# Patient Record
Sex: Female | Born: 1980 | Race: Black or African American | Hispanic: No | Marital: Single | Smoking: Never smoker
Health system: Southern US, Community
[De-identification: ages and names within clinical notes are randomized; demographics above are authoritative.]

## PROBLEM LIST (undated history)

## (undated) DIAGNOSIS — IMO0002 Reserved for concepts with insufficient information to code with codable children: Secondary | ICD-10-CM

## (undated) DIAGNOSIS — R87619 Unspecified abnormal cytological findings in specimens from cervix uteri: Secondary | ICD-10-CM

## (undated) HISTORY — PX: NO PAST SURGERIES: SHX2092

---

## 2003-09-06 ENCOUNTER — Other Ambulatory Visit: Admission: RE | Admit: 2003-09-06 | Discharge: 2003-09-06 | Payer: Self-pay | Admitting: Obstetrics and Gynecology

## 2005-04-08 ENCOUNTER — Emergency Department (HOSPITAL_COMMUNITY): Admission: EM | Admit: 2005-04-08 | Discharge: 2005-04-08 | Payer: Self-pay | Admitting: Emergency Medicine

## 2006-12-16 ENCOUNTER — Emergency Department (HOSPITAL_COMMUNITY): Admission: EM | Admit: 2006-12-16 | Discharge: 2006-12-16 | Payer: Self-pay | Admitting: Emergency Medicine

## 2007-10-02 ENCOUNTER — Emergency Department (HOSPITAL_COMMUNITY): Admission: EM | Admit: 2007-10-02 | Discharge: 2007-10-02 | Payer: Self-pay | Admitting: Family Medicine

## 2008-11-15 ENCOUNTER — Encounter: Admission: RE | Admit: 2008-11-15 | Discharge: 2008-11-15 | Payer: Self-pay | Admitting: Obstetrics & Gynecology

## 2010-10-23 LAB — POCT RAPID STREP A: Streptococcus, Group A Screen (Direct): NEGATIVE

## 2011-03-07 IMAGING — US US PELVIS COMPLETE
1 series · 14 of 25 positions shown · non-contrast
Comparison: None.

CLINICAL DATA: Right adnexal mass on physical exam.

TRANSABDOMINAL AND TRANSVAGINAL ULTRASOUND OF PELVIS
TECHNIQUE: Both transabdominal and transvaginal ultrasound
examinations of the pelvis were performed including evaluation of
the uterus, ovaries, adnexal regions, and pelvic cul-de-sac.

[Series 1: us pelvis complete · 0.22mm/px · 14 of 53 slices shown]
[im 1/53]
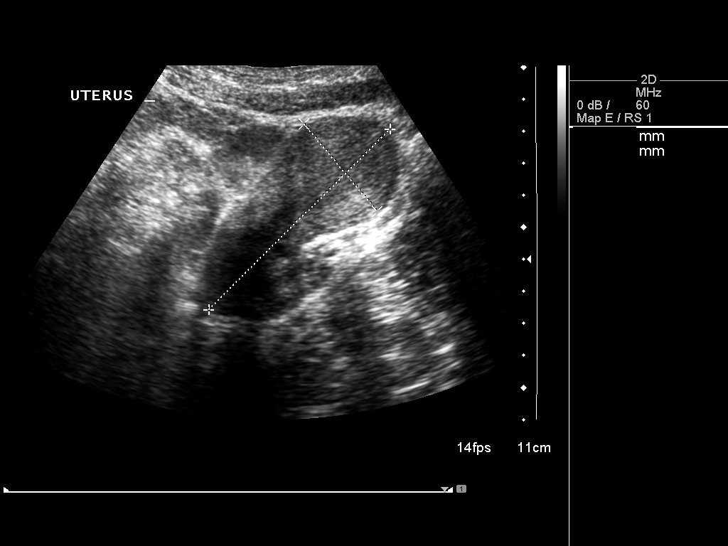
[im 5/53]
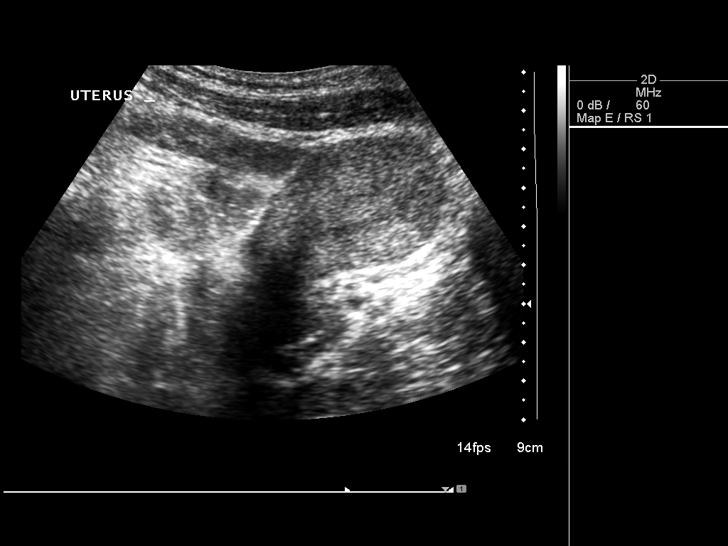
[im 9/53]
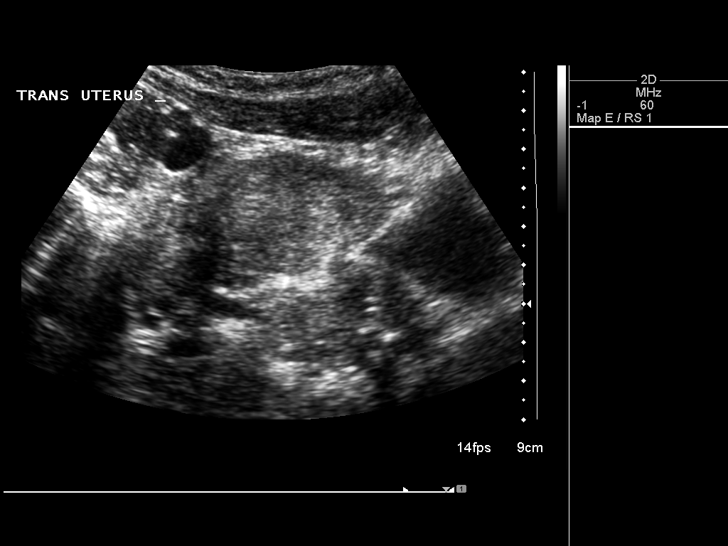
[im 14/53]
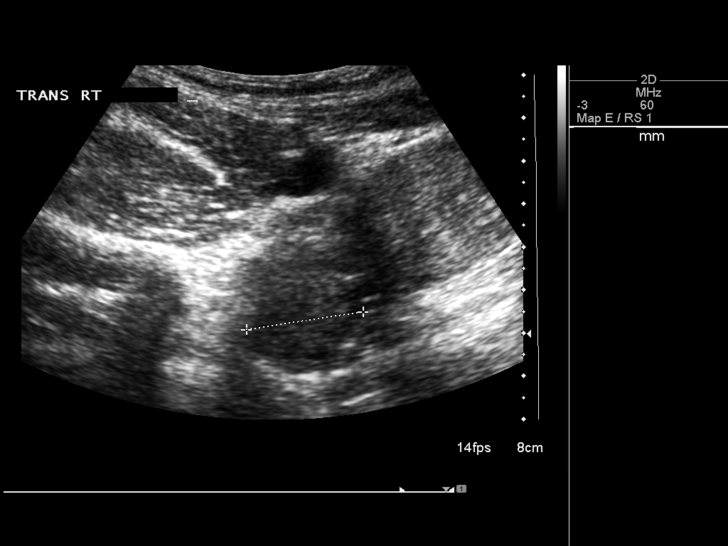
[im 18/53]
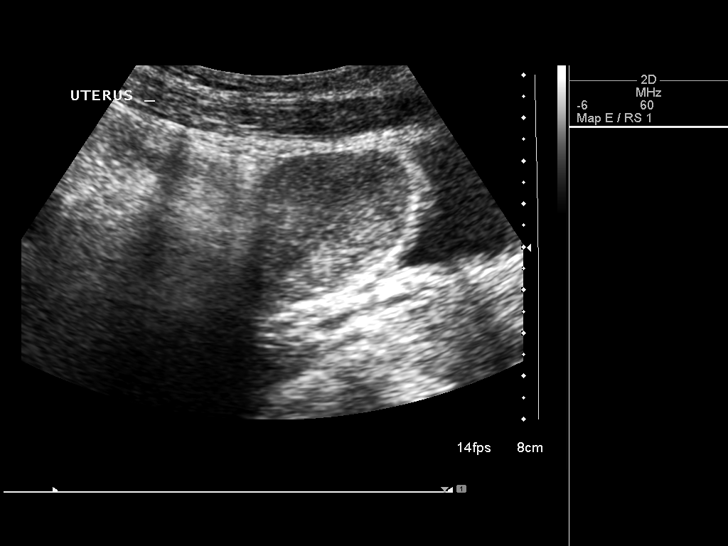
[im 20/53]
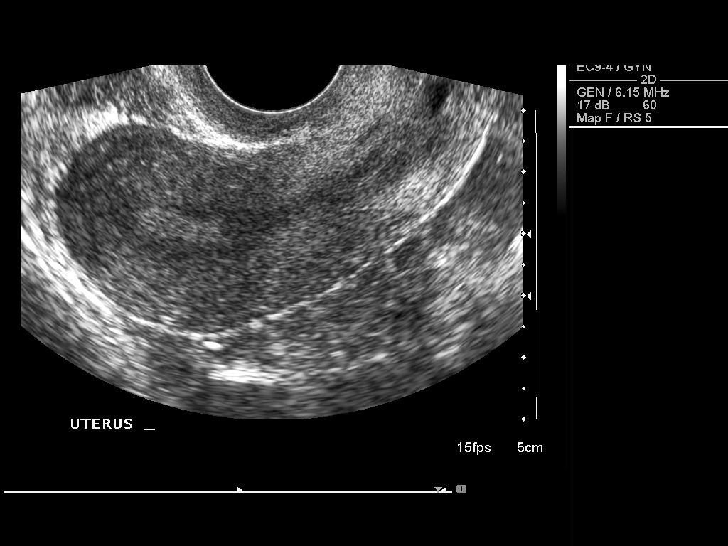
[im 24/53]
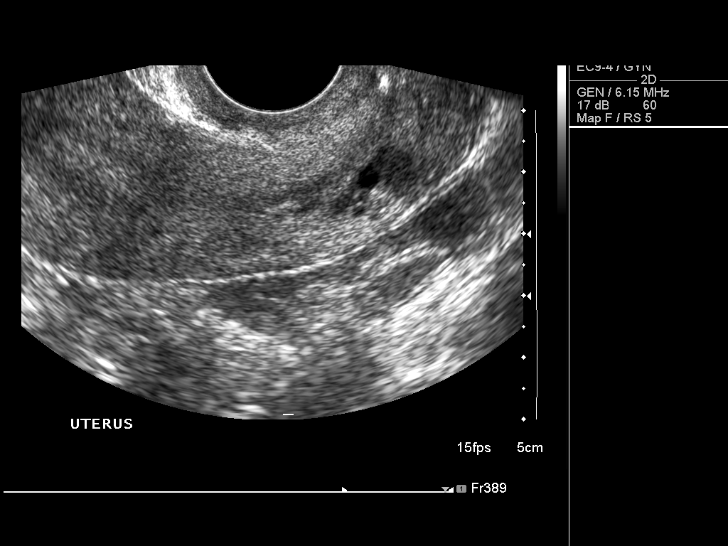
[im 29/53]
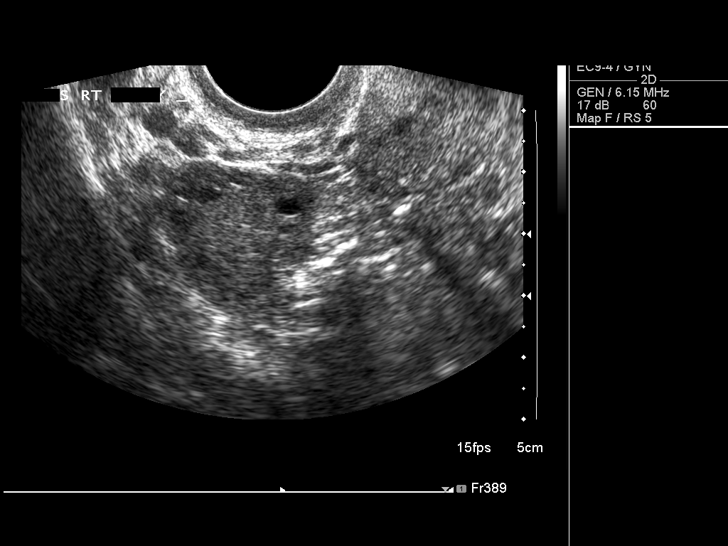
[im 33/53]
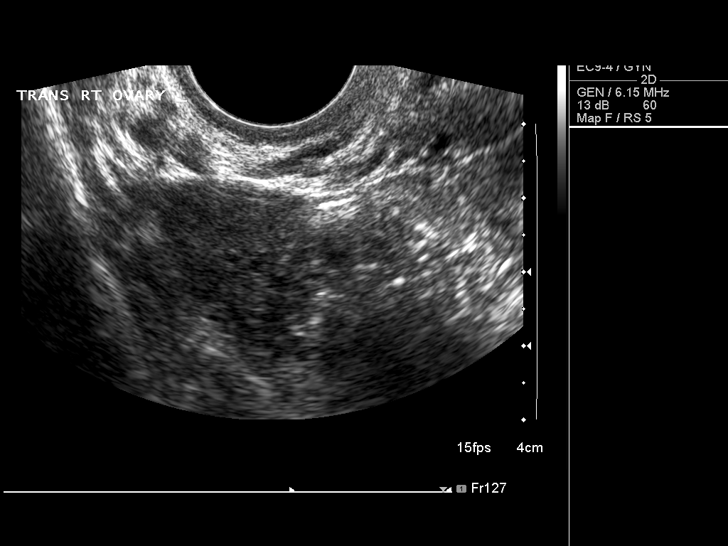
[im 35/53]
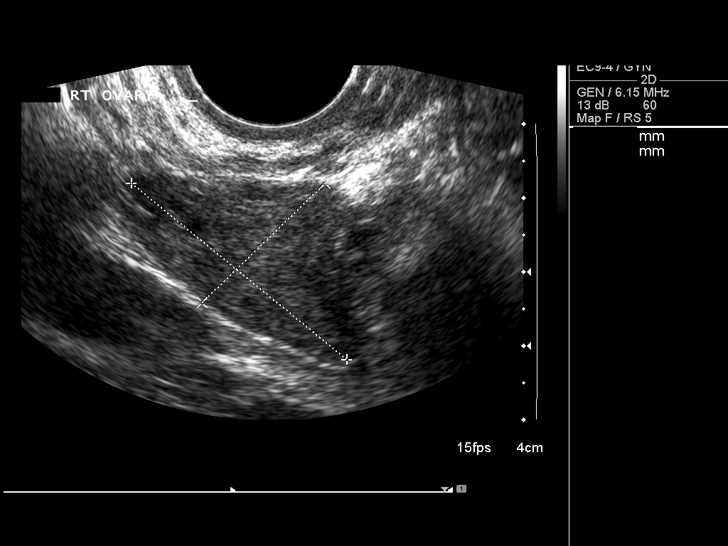
[im 40/53]
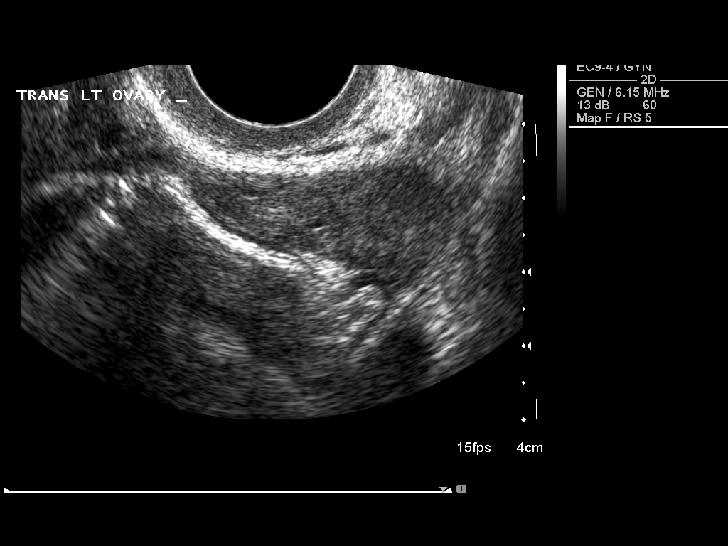
[im 44/53]
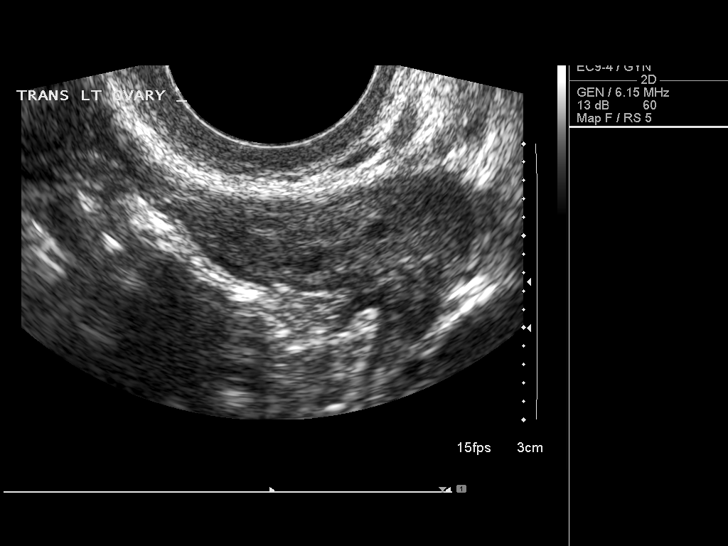
[im 48/53]
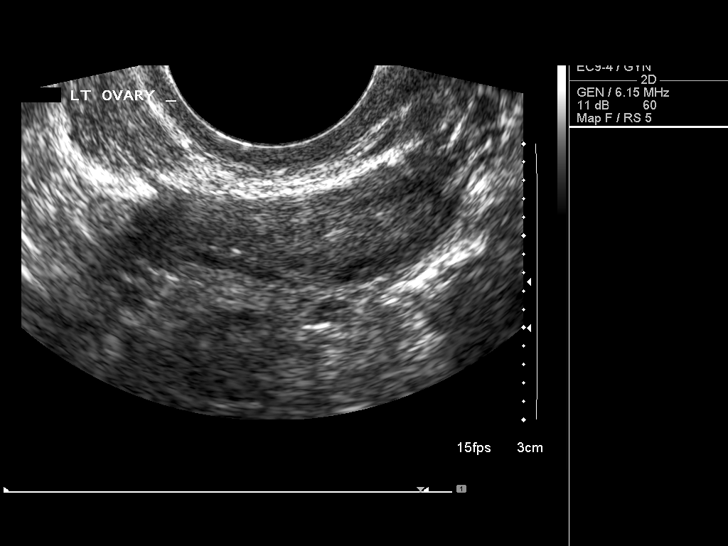
[im 53/53]
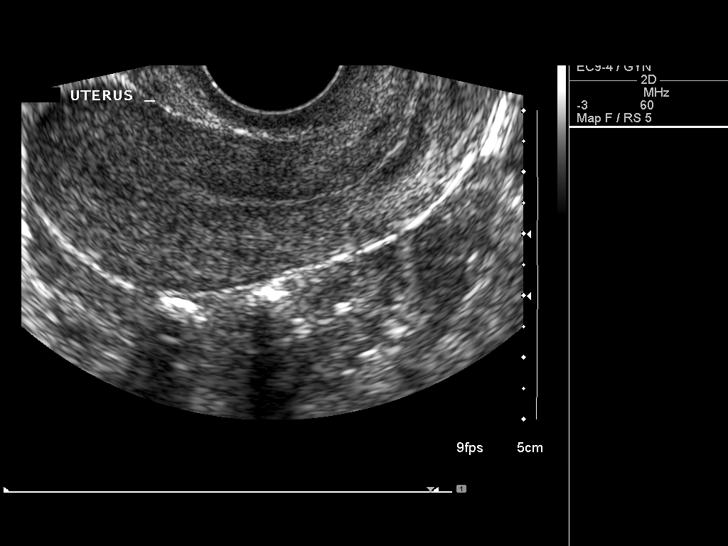

[14 of 25 positions shown; findings below may reference images not displayed]

FINDINGS: Uterus measures 8.0 x 3.6 x 4.8 cm, negative.

Endometrium measures 5 mm, within normal limits.

Right Ovary measures 3.8 x 2.3 x 2.5 cm, negative.

Left Ovary measures 3.2 x 1.0 x 3.4 cm, negative.

Other Findings:  Trace free fluid is likely physiologic.  No
evidence of a right adnexal mass.
IMPRESSION: No acute findings.  No evidence of a right adnexal mass.

## 2012-01-16 NOTE — L&D Delivery Note (Signed)
Delivery Note At 3:19 PM a viable female was delivered via Vaginal, Spontaneous Delivery (Presentation: ; Occiput Anterior).  APGAR: 8, 9; weight 4 lb 6.2 oz (1990 g).   Placenta status: Intact, Spontaneous.  Cord: 3 vessels with the following complications: None.    Anesthesia:  none Episiotomy: None Lacerations: None Suture Repair: n/a Est. Blood Loss (mL): 300  Mom to postpartum.  Baby to NICU.  Almond Fitzgibbon C. 01/05/2013, 4:10 PM

## 2012-04-22 ENCOUNTER — Ambulatory Visit: Payer: Self-pay | Admitting: Family Medicine

## 2012-04-22 VITALS — BP 136/89 | HR 89 | Temp 98.8°F | Resp 16 | Ht 64.0 in | Wt 182.0 lb

## 2012-04-22 DIAGNOSIS — Z0289 Encounter for other administrative examinations: Secondary | ICD-10-CM

## 2012-04-22 NOTE — Progress Notes (Signed)
  Tuberculosis Risk Questionnaire   1. Were you born outside the [place] in one of the following parts of the world:    [place], [place], [place], [place] or [place]?  No  2. Have you traveled outside the [place] and lived for more than one month in one of the following parts of the world:  [place], [place], [place], [place] or [place]?  No  3. Do you have a compromised immune system such as from any of the following conditions:  HIV/AIDS, organ or bone marrow transplantation, diabetes, immunosuppressive   medicines (e.g. Prednisone, Remicaide), leukemia, lymphoma, cancer of the   head or neck, gastrectomy or jejunal bypass, end-stage renal disease (on   dialysis), or silicosis?  No    4. Have you ever done one of the following:    Used crack cocaine, injected illegal drugs, worked or resided in jail or prison,   worked or resided at a homeless shelter, or worked as a Research scientist (physical sciences) in   direct contact with patients?  No  5. Have you ever been exposed to anyone with infectious tuberculosis?  No   Tuberculosis Symptom Questionnaire  Do you currently have any of the following symptoms?  1. Unexplained cough lasting more than 3 weeks? No  Unexplained fever lasting more than 3 weeks. No   3. Night Sweats (sweating that leaves the bedclothes and sheets wet)   No  4. Shortness of Breath No  5. Chest Pain No  6. Unintentional weight loss  No  7. Unexplained fatigue (very tired for no reason) No     NORMAL TB SCREEN      Sandria Bales. Alwyn Ren MD 04/22/12

## 2012-04-22 NOTE — Progress Notes (Signed)
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 She's been healthy. She needs a form completed insuring is no obvious evidence of communicable diseases. Her TB screening questionnaire is negative.  Past history: Medications: None Allergies: None Previous illnesses: None Previous surgeries: None Gravida 1 Ab1  Family history: Father's history is unknown. Mother's history reveals some chronic diseases such as hypertension and arthritis  Social history: Patient is single. No children. Drinks on weekends, heavily at times. 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  Review of systems: Constitutional: Unremarkable Cardiovascular: Unremarkable GI: Unremarkable GU: Unremarkable Skeletal: Unremarkable Dermatologic: Unremarkable Neurologic: Unremarkable Psychiatric: Unremarkable   Physical examination: Well-developed young lady in no major distress. TMs normal. Eyes PERRLA. Throat clear. Neck supple without nodes thyromegaly. Chest clear to auscultation. Heart regular without murmurs gallops or arrhythmias. Abdomen soft without mass or tenderness. Extremities unremarkable. Spine normal.  Assessment: Normal examination with no evidence of communicable diseases  Plan: Advised exercise, limit alcohol intake, and return when necessary.

## 2012-09-11 ENCOUNTER — Other Ambulatory Visit (HOSPITAL_COMMUNITY): Payer: Self-pay | Admitting: Nurse Practitioner

## 2012-09-11 DIAGNOSIS — Z0489 Encounter for examination and observation for other specified reasons: Secondary | ICD-10-CM

## 2012-09-18 ENCOUNTER — Other Ambulatory Visit: Payer: Self-pay

## 2012-09-24 ENCOUNTER — Other Ambulatory Visit (HOSPITAL_COMMUNITY): Payer: Self-pay | Admitting: Nurse Practitioner

## 2012-09-24 DIAGNOSIS — Z0489 Encounter for examination and observation for other specified reasons: Secondary | ICD-10-CM

## 2012-09-25 ENCOUNTER — Ambulatory Visit (HOSPITAL_COMMUNITY)
Admission: RE | Admit: 2012-09-25 | Discharge: 2012-09-25 | Disposition: A | Discharge status: Home / Self Care | Payer: Medicaid Other | Source: Ambulatory Visit | Attending: Nurse Practitioner | Admitting: Nurse Practitioner

## 2012-09-25 DIAGNOSIS — O3500X0 Maternal care for (suspected) central nervous system malformation or damage in fetus, unspecified, not applicable or unspecified: Secondary | ICD-10-CM | POA: Insufficient documentation

## 2012-09-25 DIAGNOSIS — Z0489 Encounter for examination and observation for other specified reasons: Secondary | ICD-10-CM

## 2012-09-25 DIAGNOSIS — Z1389 Encounter for screening for other disorder: Secondary | ICD-10-CM | POA: Insufficient documentation

## 2012-09-25 DIAGNOSIS — O358XX0 Maternal care for other (suspected) fetal abnormality and damage, not applicable or unspecified: Secondary | ICD-10-CM | POA: Insufficient documentation

## 2012-09-25 DIAGNOSIS — O350XX0 Maternal care for (suspected) central nervous system malformation in fetus, not applicable or unspecified: Secondary | ICD-10-CM | POA: Insufficient documentation

## 2012-09-25 DIAGNOSIS — Z363 Encounter for antenatal screening for malformations: Secondary | ICD-10-CM | POA: Insufficient documentation

## 2013-01-05 ENCOUNTER — Encounter (HOSPITAL_COMMUNITY): Payer: Self-pay

## 2013-01-05 ENCOUNTER — Inpatient Hospital Stay (HOSPITAL_COMMUNITY)
Admission: AD | Admit: 2013-01-05 | Discharge: 2013-01-07 | DRG: 774 | Disposition: A | Discharge status: Home / Self Care | Payer: Medicaid Other | Source: Ambulatory Visit | Attending: Obstetrics & Gynecology | Admitting: Obstetrics & Gynecology

## 2013-01-05 ENCOUNTER — Inpatient Hospital Stay (HOSPITAL_COMMUNITY): Payer: Medicaid Other

## 2013-01-05 DIAGNOSIS — O9882 Other maternal infectious and parasitic diseases complicating childbirth: Secondary | ICD-10-CM

## 2013-01-05 DIAGNOSIS — O4290 Premature rupture of membranes, unspecified as to length of time between rupture and onset of labor, unspecified weeks of gestation: Secondary | ICD-10-CM

## 2013-01-05 DIAGNOSIS — A599 Trichomoniasis, unspecified: Secondary | ICD-10-CM | POA: Diagnosis present

## 2013-01-05 DIAGNOSIS — A5901 Trichomonal vulvovaginitis: Secondary | ICD-10-CM

## 2013-01-05 DIAGNOSIS — O42919 Preterm premature rupture of membranes, unspecified as to length of time between rupture and onset of labor, unspecified trimester: Secondary | ICD-10-CM | POA: Diagnosis present

## 2013-01-05 HISTORY — DX: Reserved for concepts with insufficient information to code with codable children: IMO0002

## 2013-01-05 HISTORY — DX: Unspecified abnormal cytological findings in specimens from cervix uteri: R87.619

## 2013-01-05 LAB — COMPREHENSIVE METABOLIC PANEL
ALT: 8 U/L (ref 0–35)
AST: 14 U/L (ref 0–37)
CO2: 22 mEq/L (ref 19–32)
Calcium: 9.3 mg/dL (ref 8.4–10.5)
Chloride: 101 mEq/L (ref 96–112)
GFR calc non Af Amer: >90 mL/min (ref >90)
Sodium: 134 mEq/L — ABNORMAL LOW (ref 135–145)
Total Bilirubin: 0.3 mg/dL (ref 0.3–1.2)

## 2013-01-05 LAB — WET PREP, GENITAL
Clue Cells Wet Prep HPF POC: NONE SEEN
Yeast Wet Prep HPF POC: NONE SEEN

## 2013-01-05 LAB — CBC
MCH: 30.1 pg (ref 26.0–34.0)
MCV: 85.4 fL (ref 78.0–100.0)
Platelets: 160 10*3/uL (ref 150–400)
RBC: 4.19 MIL/uL (ref 3.87–5.11)

## 2013-01-05 LAB — ABO/RH: ABO/RH(D): O POS

## 2013-01-05 LAB — GROUP B STREP BY PCR: Group B strep by PCR: NEGATIVE

## 2013-01-05 MED ORDER — ONDANSETRON HCL 4 MG PO TABS: 4.0000 mg | ORAL_TABLET | ORAL | Status: DC | PRN

## 2013-01-05 MED ORDER — BETAMETHASONE SOD PHOS & ACET 6 (3-3) MG/ML IJ SUSP
12.0000 mg | INTRAMUSCULAR | Status: DC
  Filled 2013-01-05: qty 2

## 2013-01-05 MED ORDER — SODIUM CHLORIDE 0.9 % IV SOLN
2.0000 g | Freq: Four times a day (QID) | INTRAVENOUS | Status: DC
  Administered 2013-01-05 (×2): 2 g via INTRAVENOUS
  Filled 2013-01-05 (×4): qty 2000

## 2013-01-05 MED ORDER — WITCH HAZEL-GLYCERIN EX PADS: 1.0000 "application " | MEDICATED_PAD | CUTANEOUS | Status: DC | PRN

## 2013-01-05 MED ORDER — SODIUM CHLORIDE 0.9 % IV SOLN
250.0000 mg | Freq: Four times a day (QID) | INTRAVENOUS | Status: DC
  Administered 2013-01-05 (×2): 250 mg via INTRAVENOUS
  Filled 2013-01-05 (×4): qty 5

## 2013-01-05 MED ORDER — DIPHENHYDRAMINE HCL 25 MG PO CAPS: 25.0000 mg | ORAL_CAPSULE | Freq: Four times a day (QID) | ORAL | Status: DC | PRN

## 2013-01-05 MED ORDER — BETAMETHASONE SOD PHOS & ACET 6 (3-3) MG/ML IJ SUSP
12.0000 mg | Freq: Once | INTRAMUSCULAR | Status: AC
  Administered 2013-01-05: 12 mg via INTRAMUSCULAR
  Filled 2013-01-05: qty 2

## 2013-01-05 MED ORDER — DOCUSATE SODIUM 100 MG PO CAPS: 100.0000 mg | ORAL_CAPSULE | Freq: Every day | ORAL | Status: DC

## 2013-01-05 MED ORDER — AMOXICILLIN 500 MG PO CAPS: 500.0000 mg | ORAL_CAPSULE | Freq: Three times a day (TID) | ORAL | Status: DC

## 2013-01-05 MED ORDER — SENNOSIDES-DOCUSATE SODIUM 8.6-50 MG PO TABS
2.0000 | ORAL_TABLET | ORAL | Status: DC
  Administered 2013-01-06 (×2): 2 via ORAL
  Filled 2013-01-05 (×2): qty 2

## 2013-01-05 MED ORDER — SIMETHICONE 80 MG PO CHEW: 80.0000 mg | CHEWABLE_TABLET | ORAL | Status: DC | PRN

## 2013-01-05 MED ORDER — PRENATAL MULTIVITAMIN CH
1.0000 | ORAL_TABLET | Freq: Every day | ORAL | Status: DC
  Administered 2013-01-05: 1 via ORAL
  Filled 2013-01-05: qty 1

## 2013-01-05 MED ORDER — IBUPROFEN 600 MG PO TABS
600.0000 mg | ORAL_TABLET | Freq: Four times a day (QID) | ORAL | Status: DC
  Administered 2013-01-05 – 2013-01-07 (×8): 600 mg via ORAL
  Filled 2013-01-05 (×9): qty 1

## 2013-01-05 MED ORDER — ONDANSETRON HCL 4 MG/2ML IJ SOLN: 4.0000 mg | INTRAMUSCULAR | Status: DC | PRN

## 2013-01-05 MED ORDER — DIBUCAINE 1 % RE OINT: 1.0000 "application " | TOPICAL_OINTMENT | RECTAL | Status: DC | PRN

## 2013-01-05 MED ORDER — ZOLPIDEM TARTRATE 5 MG PO TABS: 5.0000 mg | ORAL_TABLET | Freq: Every evening | ORAL | Status: DC | PRN

## 2013-01-05 MED ORDER — PRENATAL MULTIVITAMIN CH
1.0000 | ORAL_TABLET | Freq: Every day | ORAL | Status: DC
  Administered 2013-01-06 – 2013-01-07 (×2): 1 via ORAL
  Filled 2013-01-05 (×2): qty 1

## 2013-01-05 MED ORDER — OXYTOCIN 40 UNITS IN LACTATED RINGERS INFUSION - SIMPLE MED
INTRAVENOUS | Status: AC
  Filled 2013-01-05: qty 1000

## 2013-01-05 MED ORDER — ERYTHROMYCIN BASE 250 MG PO TABS: 250.0000 mg | ORAL_TABLET | Freq: Four times a day (QID) | ORAL | Status: DC

## 2013-01-05 MED ORDER — LANOLIN HYDROUS EX OINT: TOPICAL_OINTMENT | CUTANEOUS | Status: DC | PRN

## 2013-01-05 MED ORDER — TETANUS-DIPHTH-ACELL PERTUSSIS 5-2.5-18.5 LF-MCG/0.5 IM SUSP
0.5000 mL | Freq: Once | INTRAMUSCULAR | Status: AC
  Administered 2013-01-06: 0.5 mL via INTRAMUSCULAR
  Filled 2013-01-05: qty 0.5

## 2013-01-05 MED ORDER — OXYCODONE-ACETAMINOPHEN 5-325 MG PO TABS
1.0000 | ORAL_TABLET | ORAL | Status: DC | PRN
Start: 2013-01-05 — End: 2013-01-07
  Administered 2013-01-06 (×3): 1 via ORAL
  Administered 2013-01-07: 2 via ORAL
  Administered 2013-01-07: 1 via ORAL
  Filled 2013-01-05 (×2): qty 2
  Filled 2013-01-05 (×3): qty 1

## 2013-01-05 MED ORDER — BENZOCAINE-MENTHOL 20-0.5 % EX AERO: 1.0000 "application " | INHALATION_SPRAY | CUTANEOUS | Status: DC | PRN

## 2013-01-05 MED ORDER — METRONIDAZOLE 500 MG PO TABS
2000.0000 mg | ORAL_TABLET | Freq: Once | ORAL | Status: AC
  Administered 2013-01-05: 2000 mg via ORAL
  Filled 2013-01-05: qty 4

## 2013-01-05 MED ORDER — ACETAMINOPHEN 325 MG PO TABS: 650.0000 mg | ORAL_TABLET | ORAL | Status: DC | PRN

## 2013-01-05 MED ORDER — PROMETHAZINE HCL 25 MG PO TABS
25.0000 mg | ORAL_TABLET | Freq: Four times a day (QID) | ORAL | Status: DC | PRN
  Administered 2013-01-05: 25 mg via ORAL
  Filled 2013-01-05: qty 1

## 2013-01-05 MED ORDER — CALCIUM CARBONATE ANTACID 500 MG PO CHEW: 2.0000 | CHEWABLE_TABLET | ORAL | Status: DC | PRN

## 2013-01-05 MED ORDER — OXYCODONE-ACETAMINOPHEN 5-325 MG PO TABS
1.0000 | ORAL_TABLET | ORAL | Status: DC | PRN
  Administered 2013-01-05: 2 via ORAL
  Filled 2013-01-05: qty 2

## 2013-01-05 NOTE — H&P (Signed)
Mia Alvarado is a 32 y.o. female presenting for rupture of membranes at 0300 on 01/05/13 and irregular contractions.  Pt received prenatal care at [facility] beginning at 16 wks IUP.  Pregnancy is dated by certain LMP consistent with 18 wk ultrasound.  No report of complications during pregnancy other than an elevated MSAFP.  18 wk ultrasound normal.  Pt reports headache early this AM that has since resolved.  Denies epigastric pain or vision changes.    Maternal Medical History:  Reason for admission: Rupture of membranes.   Contractions: Onset was 3-5 hours ago.   Frequency: irregular.   Perceived severity is mild.    Fetal activity: Perceived fetal activity is normal.   Last perceived fetal movement was within the past hour.      OB History   Grav Para Term Preterm Abortions TAB SAB Ect Mult Living   3    2 1 1         Past Medical History  Diagnosis Date  . Abnormal Pap smear     colposcopy > normal    Past Surgical History  Procedure Laterality Date  . No past surgeries     Family History: family history includes Alzheimer's disease in her paternal grandmother; Diabetes in her brother and maternal grandmother; Diverticulitis in her maternal grandmother; Diverticulosis in her mother; Gout in her maternal grandfather; Hypertension in her mother. Social History:  reports that she has never smoked. She does not have any smokeless tobacco history on file. She reports that she drinks alcohol. She reports that she does not use illicit drugs.   Prenatal Transfer Tool  Maternal Diabetes: No Genetic Screening: Abnormal:  Results: Elevated AFP Maternal Ultrasounds/Referrals: Normal Fetal Ultrasounds or other Referrals:  None Maternal Substance Abuse:  No Significant Maternal Medications:  Meds include: Progesterone Significant Maternal Lab Results:  Lab values include: Other: GBS unknown Other Comments:  Elevated blood pressure.  Review of Systems  Eyes: Negative for blurred  vision.  Gastrointestinal: Positive for abdominal pain (contractions).  Genitourinary:       Rupture of membranes  Neurological: Positive for headaches.  All other systems reviewed and are negative.      Blood pressure 131/90, pulse 113, temperature 98.6 F (37 C), temperature source Oral, resp. rate 18, last menstrual period 05/19/2012. Exam Physical Exam  Physical Exam  Constitutional: She is oriented to person, place, and time. She appears well-developed and well-nourished. No distress.  HENT:  Head: Normocephalic.  Eyes: Pupils are equal, round, and reactive to light.  Neck: Normal range of motion. Neck supple.  Cardiovascular: Normal rate and regular rhythm.  Respiratory: Effort normal and breath sounds normal.  GI: Soft. There is no tenderness.  Genitourinary: No bleeding around the vagina. Vaginal discharge (+pooling, +fern) found.  Cervix visually closed  Musculoskeletal: Normal range of motion. Edema: 2+ bilat pedal edema.  Neurological: She is alert and oriented to person, place, and time. She displays abnormal reflex (2+ bilat).  Skin: Skin is warm and dry.   Prenatal labs: ABO, Rh:   Antibody:   Rubella:   RPR:    HBsAg:    HIV:    GBS:     Assessment/Plan: 32 yo G3P0020 at [redacted]w[redacted]d weeks IUP  Preterm Premature Rupture of Membranes  Elevated Blood Pressure    Plan:  Admit to antenatal  Betamethasone IM x 2  Begin IV antibiotics  Obtain CBC, CMP  OB Ultrasound for presentation  Obtain wet prep, GBS pcr, GC/CT   [facility]  01/05/2013, 6:24 AM

## 2013-01-05 NOTE — Progress Notes (Signed)
Patient ID: Mia Alvarado, female   DOB: June 27, 1980, 32 y.o.   MRN: [id number]  Called for report of increased pain.  Contractions occurring about every 5-7 minutes FHR not tracing.  Advised to try one Percocet and report back if pain is unrelieved, may need to check cervix.

## 2013-01-05 NOTE — Progress Notes (Signed)
Patient ID: Mia Alvarado, female   DOB: May 12, 1980, 32 y.o.   MRN: [id number]  Results for orders placed during the hospital encounter of 01/05/13 (from the past 24 hour(s))  WET PREP, GENITAL     Status: Abnormal   Collection Time    01/05/13  6:08 AM      Result Value Range   Yeast Wet Prep HPF POC NONE SEEN  NONE SEEN   Trich, Wet Prep FEW (*) NONE SEEN   Clue Cells Wet Prep HPF POC NONE SEEN  NONE SEEN   WBC, Wet Prep HPF POC FEW (*) NONE SEEN  GROUP B STREP BY PCR     Status: None   Collection Time    01/05/13  6:08 AM      Result Value Range   Group B strep by PCR NEGATIVE  NEGATIVE   Noted to be positive for Trichomonas  Patient notified of diagnosis and need for treatment Informed needs to tell partner who needs to be treated also  WIll treat with Flagyl 2gm PO x 1

## 2013-01-05 NOTE — MAU Note (Signed)
"  Water broke at 0308"  Clear fluid with blood tinge mucus.  Felt some contractions start.

## 2013-01-05 NOTE — Progress Notes (Signed)
Will transfer to 172 via wheelchair

## 2013-01-05 NOTE — Progress Notes (Signed)
Patient ID: Mia Alvarado, female   DOB: 04-08-1980, 32 y.o.   MRN: [id number]  Notified that patient's cervix is 9 cm dilated UCs did not slow down Feels pressure Dr Marice Potter at bedside  Will transfer to [facility] for delivery

## 2013-01-05 NOTE — MAU Provider Note (Signed)
  History     CSN: [id number]  Arrival date and time: 01/05/13 0454   None     Chief Complaint  Patient presents with  . Labor Eval   HPI  Pt is a 32 yo G3P0020 at [redacted]w[redacted]d weeks IUP here with report of gush of fluid around 0300 this AM while laying in bed.  Pt reports felt a burst and went to bathroom and continued to see fluid.  Reports contractions started around 30 minutes later and occuring every 10 minutes.  [facility] with no complications.  Pt denies vision changes or epigastric pain.  +headache this am that has since resolved.    Past Medical History  Diagnosis Date  . Abnormal Pap smear     colposcopy > normal     Past Surgical History  Procedure Laterality Date  . No past surgeries      Family History  Problem Relation Age of Onset  . Hypertension Mother   . Diverticulosis Mother   . Diabetes Brother   . Diabetes Maternal Grandmother   . Diverticulitis Maternal Grandmother   . Gout Maternal Grandfather   . Alzheimer's disease Paternal Grandmother     History  Substance Use Topics  . Smoking status: Never Smoker   . Smokeless tobacco: Not on file  . Alcohol Use: Yes    Allergies: No Known Allergies  Prescriptions prior to admission  Medication Sig Dispense Refill  . Prenatal Vit-Fe Fumarate-FA (PRENATAL MULTIVITAMIN) TABS tablet Take 1 tablet by mouth daily at 12 noon.        Review of Systems  Eyes: Negative for blurred vision and double vision.  Gastrointestinal: Positive for abdominal pain. Diarrhea: contractions.  Genitourinary:       Leaking of fluid  Neurological: Positive for headaches.  All other systems reviewed and are negative.   Physical Exam   Blood pressure 131/90, pulse 113, temperature 98.6 F (37 C), temperature source Oral, resp. rate 18, last menstrual period 05/19/2012.  Physical Exam  Constitutional: She is oriented to person, place, and time. She appears well-developed and  well-nourished. No distress.  HENT:  Head: Normocephalic.  Eyes: Pupils are equal, round, and reactive to light.  Neck: Normal range of motion. Neck supple.  Cardiovascular: Normal rate and regular rhythm.   Respiratory: Effort normal and breath sounds normal.  GI: Soft. There is no tenderness.  Genitourinary: No bleeding around the vagina. Vaginal discharge (+pooling, +fern) found.  Cervix visually closed  Musculoskeletal: Normal range of motion. Edema:  2+ bilat pedal edema.  Neurological: She is alert and oriented to person, place, and time. She displays abnormal reflex (2+ bilat).  Skin: Skin is warm and dry.    MAU Course  Procedures   Assessment and Plan  32 yo G3P0020 at [redacted]w[redacted]d weeks IUP Preterm Premature Rupture of Membranes Elevated Blood Pressure  Plan: Admit to antenatal Betamethasone IM x 2 Begin IV antibiotics Obtain CBC, CMP OB Ultrasound for presentation Obtain wet prep, GBS pcr, GC/CT  [facility] 01/05/2013, 5:45 AM

## 2013-01-06 DIAGNOSIS — A599 Trichomoniasis, unspecified: Secondary | ICD-10-CM | POA: Diagnosis present

## 2013-01-06 LAB — GC/CHLAMYDIA PROBE AMP
CT Probe RNA: NEGATIVE
GC Probe RNA: NEGATIVE

## 2013-01-06 NOTE — Progress Notes (Signed)
Post Partum Day 1  Subjective: No complaints, up ad lib, voiding, tolerating PO and + flatus .  Baby doing well in NICU.  Objective: Blood pressure 112/61, pulse 109, temperature 98.1 F (36.7 C), temperature source Oral, resp. rate 20, height 5\' 3"  (1.6 m), weight 222 lb 6 oz (100.869 kg), last menstrual period 05/19/2012, SpO2 100.00%, unknown if currently breastfeeding.  Physical Exam:  General: alert and no distress Lochia: appropriate Uterine Fundus: firm DVT Evaluation: No evidence of DVT seen on physical exam. Negative Homan's sign.   Recent Labs  01/05/13 0650  HGB 12.6  HCT 35.8*    Assessment/Plan: Doing well postpartum Had a lengthy discussion with patient and her mother about circumstances of her labor and delivery. She was upset she did not get an epidural and that her labor progressed quickly; she was told that there was no way to slow down true preterm labor, especially in the setting of possible infection (patient was diagnosed with trichomonas).  She was upset that she did not get an epidural in time; she was told that patients who rapidly progress in labor occasionally do not have time to get an epidural.  At the end of the discussion, the patient and her mother seemed satisfied with the explanations.  Breastfeeding Contraception: IUD vs Nexplanon Plan for discharge tomorrow   LOS: 1 day   Tereso Newcomer, M.D. 01/06/2013, 1:39 PM

## 2013-01-06 NOTE — Lactation Note (Signed)
This note was copied from the chart of Mia Sharmain Lastra. Lactation Consultation Note     Initial consult with this mom of a NICU baby, now 21 hours pot partum, and 33 1/7 weeks corrected gestation. Mom has been pumping, and expressed 6 mls  of colostrum, a very large amount. I showed mom how to set premie setting, and how to hand express. Mom return demonstrated with good technique. Basic pumping teaching done from the NICU booklet on providing EBm for a NICU baby. Mom is active with [facility] and will need a [facility] loaner on discharge, if we have one available.   Patient Name: Mia Alvarado Date: 01/06/2013 Reason for consult: Initial assessment;NICU baby;Infant < 6lbs;Late preterm infant   Maternal Data Formula Feeding for Exclusion: Yes (baby in NICU) Infant to breast within first hour of birth: No Breastfeeding delayed due to:: Infant status Has patient been taught Hand Expression?: Yes Does the patient have breastfeeding experience prior to this delivery?: No  Feeding    LATCH Score/Interventions                      Lactation Tools Discussed/Used Tools: Pump Breast pump type: Double-Electric Breast Pump WIC Program: Yes (mom will need a loaner DEP, WIC closed until 12/29) Initiated by:: bedisde Rn  Date initiated:: 01/05/13   Consult Status Consult Status: Follow-up Date: 01/07/13 Follow-up type: In-patient    Alfred Levins 01/06/2013, 12:55 PM

## 2013-01-06 NOTE — Progress Notes (Signed)
UR completed 

## 2013-01-07 MED ORDER — IBUPROFEN 600 MG PO TABS: 600.0000 mg | ORAL_TABLET | Freq: Four times a day (QID) | ORAL | Status: AC

## 2013-01-07 NOTE — Progress Notes (Signed)
Pt discharged to home with sister.  Pt ambulated to NICU with plans to leave hospital from there.  Pt home with rented breast pump from hospital lactation department.  No other equipment for home ordered at discharge.

## 2013-01-07 NOTE — Discharge Summary (Signed)
Obstetric Discharge Summary Reason for Admission: PPROM and preterm labor Prenatal Procedures: NST and ultrasound Intrapartum Procedures: spontaneous vaginal delivery, elevated BP with normal PIH labs Postpartum Procedures: none Complications-Operative and Postpartum: none Hemoglobin  Date Value Range Status  01/05/2013 12.6  12.0 - 15.0 g/dL Final     HCT  Date Value Range Status  01/05/2013 35.8* 36.0 - 46.0 % Final    Physical Exam:  General: alert, cooperative and no distress Lochia: appropriate Uterine Fundus: firm Incision: na DVT Evaluation: No evidence of DVT seen on physical exam. No significant calf/ankle edema.  Discharge Diagnoses: PPROM and subsequent PTL with delivery  Discharge Information: Date: 01/07/2013 Activity: pelvic rest Diet: routine Medications: PNV and Ibuprofen Condition: stable Instructions: refer to practice specific booklet Discharge to: home Follow-up Information   Follow up with [facility]-. Schedule an appointment as soon as possible for a visit in 6 weeks. (for your postpartum visit)    Contact information:   [address] [place] [address] [phone]      Newborn Data: Live born female  Birth Weight: 4 lb 6.2 oz (1990 g) APGAR: 8, 9  Home with mother.  32 yo [id number] who presented with PPROM at 33 weeks and went into PTL.  She progressed to deliver a liveborn female via NSVD without complications.  Her postpartum care was uncomplicated. She did have some elevated BPs on admission with negative labs for pre-eclampsia. Post delivery, her pressures normalized.  Pt was found to be trichomonas positive and was treated while in house.  She is breast feeding and desires nexplanon for contraception.  Pt to f/u at the health department in 6 weeks.   Parilee Hally L 01/07/2013, 7:54 AM

## 2013-01-10 ENCOUNTER — Inpatient Hospital Stay (HOSPITAL_COMMUNITY)
Admission: AD | Admit: 2013-01-10 | Discharge: 2013-01-11 | Disposition: A | Discharge status: Home / Self Care | Payer: Medicaid Other | Source: Ambulatory Visit | Attending: Obstetrics & Gynecology | Admitting: Obstetrics & Gynecology

## 2013-01-10 ENCOUNTER — Inpatient Hospital Stay (HOSPITAL_COMMUNITY): Payer: Medicaid Other

## 2013-01-10 ENCOUNTER — Encounter (HOSPITAL_COMMUNITY): Payer: Self-pay | Admitting: *Deleted

## 2013-01-10 DIAGNOSIS — R1011 Right upper quadrant pain: Secondary | ICD-10-CM | POA: Insufficient documentation

## 2013-01-10 DIAGNOSIS — O99893 Other specified diseases and conditions complicating puerperium: Secondary | ICD-10-CM | POA: Insufficient documentation

## 2013-01-10 DIAGNOSIS — R1013 Epigastric pain: Secondary | ICD-10-CM | POA: Insufficient documentation

## 2013-01-10 DIAGNOSIS — R109 Unspecified abdominal pain: Secondary | ICD-10-CM

## 2013-01-10 DIAGNOSIS — M5380 Other specified dorsopathies, site unspecified: Secondary | ICD-10-CM | POA: Insufficient documentation

## 2013-01-10 LAB — COMPREHENSIVE METABOLIC PANEL
ALT: 15 U/L (ref 0–35)
CO2: 24 mEq/L (ref 19–32)
Calcium: 9.1 mg/dL (ref 8.4–10.5)
Chloride: 103 mEq/L (ref 96–112)
Creatinine, Ser: 0.72 mg/dL (ref 0.50–1.10)
GFR calc Af Amer: >90 mL/min (ref >90)
GFR calc non Af Amer: >90 mL/min (ref >90)
Glucose, Bld: 91 mg/dL (ref 70–99)
Sodium: 136 mEq/L (ref 135–145)
Total Bilirubin: 0.2 mg/dL — ABNORMAL LOW (ref 0.3–1.2)
Total Protein: 6.6 g/dL (ref 6.0–8.3)

## 2013-01-10 LAB — CBC
HCT: 35.9 % — ABNORMAL LOW (ref 36.0–46.0)
Hemoglobin: 12.4 g/dL (ref 12.0–15.0)
MCH: 29.6 pg (ref 26.0–34.0)
MCHC: 34.5 g/dL (ref 30.0–36.0)
MCV: 85.7 fL (ref 78.0–100.0)
RBC: 4.19 MIL/uL (ref 3.87–5.11)
WBC: 12 10*3/uL — ABNORMAL HIGH (ref 4.0–10.5)

## 2013-01-10 MED ORDER — GI COCKTAIL ~~LOC~~
30.0000 mL | Freq: Once | ORAL | Status: AC
  Administered 2013-01-10: 30 mL via ORAL
  Filled 2013-01-10: qty 30

## 2013-01-10 MED ORDER — CYCLOBENZAPRINE HCL 10 MG PO TABS
10.0000 mg | ORAL_TABLET | Freq: Once | ORAL | Status: AC
  Administered 2013-01-10: 10 mg via ORAL
  Filled 2013-01-10: qty 1

## 2013-01-10 NOTE — MAU Provider Note (Signed)
History     CSN: [id number]  Arrival date and time: 01/10/13 1857   None     Chief Complaint  Patient presents with  . Abdominal Pain   HPI This is a 32 y.o. female who is 5 days postpartum following a vaginal delivery at 33 weeks presents with complaint of abdominal pain. Pain is epigastric and started yesterday. No nausea, vomiting, diarrhea or constipation. No fever.  Was treated with amoxicillin and erythromycin prior to delivery. Was found positive for trichomonas and was treated with Flagyl also.   RN Note: Patient presents with complaint of postpartal abdominal pain X 5 days.      OB History   Grav Para Term Preterm Abortions TAB SAB Ect Mult Living   3 1  1 2 1 1   1       Past Medical History  Diagnosis Date  . Abnormal Pap smear     colposcopy > normal     Past Surgical History  Procedure Laterality Date  . No past surgeries      Family History  Problem Relation Age of Onset  . Hypertension Mother   . Diverticulosis Mother   . Diabetes Brother   . Diabetes Maternal Grandmother   . Diverticulitis Maternal Grandmother   . Gout Maternal Grandfather   . Alzheimer's disease Paternal Grandmother     History  Substance Use Topics  . Smoking status: Never Smoker   . Smokeless tobacco: Never Used  . Alcohol Use: No    Allergies: No Known Allergies  Prescriptions prior to admission  Medication Sig Dispense Refill  . ibuprofen (ADVIL,MOTRIN) 600 MG tablet Take 1 tablet (600 mg total) by mouth every 6 (six) hours.  30 tablet  0  . Prenatal Vit-Fe Fumarate-FA (PRENATAL MULTIVITAMIN) TABS tablet Take 1 tablet by mouth daily at 12 noon.        Review of Systems  Constitutional: Negative for fever, chills and malaise/fatigue.  Gastrointestinal: Positive for abdominal pain. Negative for nausea, vomiting, diarrhea and constipation.  Genitourinary: Negative for dysuria.  Musculoskeletal: Negative for myalgias.  Neurological: Negative for dizziness,  weakness and headaches.   Physical Exam   Last menstrual period 05/19/2012, unknown if currently breastfeeding.  Physical Exam  Constitutional: She is oriented to person, place, and time. She appears well-developed and well-nourished. No distress.  HENT:  Head: Normocephalic.  Cardiovascular: Normal rate.   Respiratory: Effort normal.  GI: Soft. She exhibits no distension and no mass. There is tenderness (Slightly tender upper abdomen, lower abdomen completely nontender). There is no rebound and no guarding.  Musculoskeletal: Normal range of motion.  Neurological: She is alert and oriented to person, place, and time.  Skin: Skin is warm and dry.  Psychiatric: She has a normal mood and affect.   Results for orders placed during the hospital encounter of 01/10/13 (from the past 72 hour(s))  CBC     Status: Abnormal   Collection Time    01/10/13  8:50 PM      Result Value Range   WBC 12.0 (*) 4.0 - 10.5 K/uL   RBC 4.19  3.87 - 5.11 MIL/uL   Hemoglobin 12.4  12.0 - 15.0 g/dL   HCT 40.9 (*) 81.1 - 91.4 %   MCV 85.7  78.0 - 100.0 fL   MCH 29.6  26.0 - 34.0 pg   MCHC 34.5  30.0 - 36.0 g/dL   RDW 78.2  95.6 - 21.3 %   Platelets 227  150 - 400  K/uL  COMPREHENSIVE METABOLIC PANEL     Status: Abnormal   Collection Time    01/10/13  8:50 PM      Result Value Range   Sodium 136  135 - 145 mEq/L   Potassium 3.8  3.5 - 5.1 mEq/L   Chloride 103  96 - 112 mEq/L   CO2 24  19 - 32 mEq/L   Glucose, Bld 91  70 - 99 mg/dL   BUN 12  6 - 23 mg/dL   Creatinine, Ser 1.61  0.50 - 1.10 mg/dL   Calcium 9.1  8.4 - 09.6 mg/dL   Total Protein 6.6  6.0 - 8.3 g/dL   Albumin 2.6 (*) 3.5 - 5.2 g/dL   AST 19  0 - 37 U/L   ALT 15  0 - 35 U/L   Alkaline Phosphatase 88  39 - 117 U/L   Total Bilirubin 0.2 (*) 0.3 - 1.2 mg/dL   GFR calc non Af Amer >90  >90 mL/min   GFR calc Af Amer >90  >90 mL/min   Comment: (NOTE)     The eGFR has been calculated using the CKD EPI equation.     This calculation has not  been validated in all clinical situations.     eGFR's persistently <90 mL/min signify possible Chronic Kidney     Disease.     MAU Course  Procedures  MDM Will try some GI cocktail May be GERD  >>  No relief from med.   Now tells me she has also had some midback spasm along with upper abdominal pain.  Spasms come and go and are sometimes very strong. Has been leaning forward for breast pumping.  Working with Lactation to get her technique improved. ?referred pain from spasm Will try Flexeril to see if it helps.  >>  Got some relief then pain came back Will get RUQ ultrasound  >> US Abdomen Limited Ruq  01/10/2013   CLINICAL DATA:  Right upper quadrant pain  EXAM: US ABDOMEN LIMITED - RIGHT UPPER QUADRANT  COMPARISON:  None.    FINDINGS: Gallbladder  No gallstones or wall thickening visualized. No sonographic Murphy sign noted.  Common bile duct  Diameter: 2 mm.  Liver:  No focal lesion identified. Within normal limits in parenchymal echogenicity.   IMPRESSION: No acute finding.   Electronically Signed   By: Ruel Favors M.D.   On: 01/10/2013 23:59   Discussed with Dr Penne Lash and ER physician. He recommends expectant management.  We could do a CT, but would not expect to see anything necessarily, as [place] is best for imaging gallbladder.  May try Miralax to see if this could be gas pains that need to be moved through. Patient instructed to use Flexeril prn and if pain persists or gets worse or if she develops any other symptoms, go to [facility] or [facility] ED.  Assessment and Plan  A:  Postpartum x 5 days following preterm delivery      Right upper quadrant and epigastric pain      Undetermined cause of pain      Back spasms  P:  Discussed with patient      Will continue Flexeril prn for back pain       Tums or Zantac prn reflux       Miralax x 1-2 days       Advised to go to ED if persists, worsens or develops other symptoms  [facility] 01/10/2013, 7:21 PM

## 2013-01-10 NOTE — MAU Note (Signed)
Patient presents with complaint of postpartal abdominal pain X 5 days.

## 2013-01-11 DIAGNOSIS — R109 Unspecified abdominal pain: Secondary | ICD-10-CM

## 2013-01-11 MED ORDER — OXYCODONE-ACETAMINOPHEN 5-325 MG PO TABS: 1.0000 | ORAL_TABLET | ORAL | Status: AC | PRN

## 2013-01-11 MED ORDER — POLYETHYLENE GLYCOL 3350 17 G PO PACK: 17.0000 g | PACK | Freq: Every day | ORAL | Status: AC

## 2013-01-11 NOTE — Progress Notes (Signed)
Wynelle Bourgeois CNM in earlier and discussed test results and d/c plan. WRitten and verbal d/c instructions given and understanding voiced

## 2013-01-12 NOTE — Discharge Summary (Signed)
Attestation of Attending Supervision of Obstetric Fellow: Evaluation and management procedures were performed by the Obstetric Fellow under my supervision and collaboration.  I have reviewed the Obstetric Fellow's note and chart, and I agree with the management and plan.  Arzell Mcgeehan, MD, FACOG Attending Obstetrician & Gynecologist Faculty Practice, [facility]   

## 2013-01-18 NOTE — MAU Provider Note (Signed)
Attestation of Attending Supervision of Advanced Practitioner (CNM/NP): Evaluation and management procedures were performed by the Advanced Practitioner under my supervision and collaboration. I have reviewed the Advanced Practitioner's note and chart, and I agree with the management and plan.  Gus Littler H. 12:26 PM

## 2013-01-19 ENCOUNTER — Ambulatory Visit: Payer: Self-pay

## 2013-01-19 NOTE — Lactation Note (Signed)
This note was copied from the chart of Mia Alvarado Longbottom. Lactation Consultation Note     Follow up consult with this mom of a now [redacted] week gestation baby, being discharged from the NICU today. Mom is pumping and bottle feeding, but would like to transition to breast feeding. i reviewed with her how her baby is a late pre term baby, and it will take a few weeks to transition him. I advised mom to get settled at home, and then call for an outpatient lactation consult with me, to I can help her with transitioning her baby to breast feeding.   Patient Name: Mia Alvarado Wiers [id number]'s Date: 01/19/2013     Maternal Data    Feeding    LATCH Score/Interventions                      Lactation Tools Discussed/Used     Consult Status      Alfred LevinsLee, Diontae Route Anne 01/19/2013, 5:02 PM

## 2013-11-16 ENCOUNTER — Encounter (HOSPITAL_COMMUNITY): Payer: Self-pay | Admitting: *Deleted

## 2015-06-14 ENCOUNTER — Ambulatory Visit (HOSPITAL_COMMUNITY)
Admission: EM | Admit: 2015-06-14 | Discharge: 2015-06-14 | Disposition: A | Discharge status: Home / Self Care | Payer: Medicaid Other | Attending: Family Medicine | Admitting: Family Medicine

## 2015-06-14 ENCOUNTER — Encounter (HOSPITAL_COMMUNITY): Payer: Self-pay | Admitting: Emergency Medicine

## 2015-06-14 DIAGNOSIS — L0291 Cutaneous abscess, unspecified: Secondary | ICD-10-CM

## 2015-06-14 MED ORDER — SULFAMETHOXAZOLE-TRIMETHOPRIM 800-160 MG PO TABS: 1.0000 | ORAL_TABLET | Freq: Two times a day (BID) | ORAL | Status: AC

## 2015-06-14 MED ORDER — MUPIROCIN CALCIUM 2 % EX CREA: 1.0000 "application " | TOPICAL_CREAM | Freq: Two times a day (BID) | CUTANEOUS | Status: AC

## 2015-06-14 NOTE — ED Notes (Signed)
PT reports chills and hot flashes last Saturday. Body aches last Sunday. These symptoms resolved Sunday. Monday, PT noticed a boil on left inner thigh. Boil worsened. Boil came to a head Sunday night and began leaking. PT tried to open it more with a safety pin. PT reports it is looking better now, but she is concerned it may not heal without antibiotics.

## 2015-06-14 NOTE — ED Provider Notes (Signed)
CSN: [id number]     Arrival date & time 06/14/15  1532 History   First MD Initiated Contact with Patient 06/14/15 1610     No chief complaint on file.  (Consider location/radiation/quality/duration/timing/severity/associated sxs/prior Treatment) HPI History obtained from patient:  Pt presents with the cc of:  Left thigh Duration of symptoms: 3 days Treatment prior to arrival: Hot packs Context: Sudden onset of swelling left thigh Other symptoms include: none Pain score: 3 FAMILY HISTORY: Hypertension-mother    Past Medical History  Diagnosis Date  . Abnormal Pap smear     colposcopy > normal    Past Surgical History  Procedure Laterality Date  . No past surgeries     Family History  Problem Relation Age of Onset  . Hypertension Mother   . Diverticulosis Mother   . Diabetes Brother   . Diabetes Maternal Grandmother   . Diverticulitis Maternal Grandmother   . Gout Maternal Grandfather   . Alzheimer's disease Paternal Grandmother    Social History  Substance Use Topics  . Smoking status: Never Smoker   . Smokeless tobacco: Never Used  . Alcohol Use: No   OB History    Gravida Para Term Preterm AB TAB SAB Ectopic Multiple Living   3 1  1 2 1 1   1      Review of Systems  Denies: HEADACHE, NAUSEA, ABDOMINAL PAIN, CHEST PAIN, CONGESTION, DYSURIA, SHORTNESS OF BREATH  Allergies  Review of patient's allergies indicates no known allergies.  Home Medications   Prior to Admission medications   Medication Sig Start Date End Date Taking? Authorizing Provider  ibuprofen (ADVIL,MOTRIN) 600 MG tablet Take 1 tablet (600 mg total) by mouth every 6 (six) hours. 01/07/13   Vale HavenKeli L Beck, MD  oxyCODONE-acetaminophen (ROXICET) 5-325 MG per tablet Take 1-2 tablets by mouth every 4 (four) hours as needed for severe pain. 01/11/13   Aviva SignsMarie L Williams, CNM  polyethylene glycol [facility](MIRALAX) packet Take 17 g by mouth daily. 01/11/13   Aviva SignsMarie L Williams, CNM  Prenatal Vit-Fe Fumarate-FA  (PRENATAL MULTIVITAMIN) TABS tablet Take 1 tablet by mouth daily at 12 noon.    Historical Provider, MD   Meds Ordered and Administered this Visit  Medications - No data to display  There were no vitals taken for this visit. No data found.   Physical Exam NURSES NOTES AND VITAL SIGNS REVIEWED. CONSTITUTIONAL: Well developed, well nourished, no acute distress HEENT: normocephalic, atraumatic EYES: Conjunctiva normal NECK:normal ROM, supple, no adenopathy PULMONARY:No respiratory distress, normal effort ABDOMINAL: Soft, ND, NT BS+, No CVAT MUSCULOSKELETAL: Normal ROM of all extremities,  SKIN: warm and dry without rash PSYCHIATRIC: Mood and affect, behavior are normal  ED Course  Procedures (including critical care time)  Labs Review Labs Reviewed - No data to display  Imaging Review No results found.   Visual Acuity Review  Right Eye Distance:   Left Eye Distance:   Bilateral Distance:    Right Eye Near:   Left Eye Near:    Bilateral Near:      RX bactrim    MDM   1. Abscess     Patient is reassured that there are no issues that require transfer to higher level of care at this time or additional tests. Patient is advised to continue home symptomatic treatment. Patient is advised that if there are new or worsening symptoms to attend the emergency department, contact primary care provider, or return to UC. Instructions of care provided discharged home in stable condition.  THIS NOTE WAS GENERATED USING A VOICE RECOGNITION SOFTWARE PROGRAM. ALL REASONABLE EFFORTS  WERE MADE TO PROOFREAD THIS DOCUMENT FOR ACCURACY.  I have verbally reviewed the discharge instructions with the patient. A printed AVS was given to the patient.  All questions were answered prior to discharge.      Tharon Aquas, PA 06/14/15 [phone]

## 2015-06-14 NOTE — Discharge Instructions (Signed)
Abscess °An abscess (boil or furuncle) is an infected area on or under the skin. This area is filled with yellowish-white fluid (pus) and other material (debris). °HOME CARE  °· Only take medicines as told by your doctor. °· If you were given antibiotic medicine, take it as directed. Finish the medicine even if you start to feel better. °· If gauze is used, follow your doctor's directions for changing the gauze. °· To avoid spreading the infection: °¨ Keep your abscess covered with a bandage. °¨ Wash your hands well. °¨ Do not share personal care items, towels, or whirlpools with others. °¨ Avoid skin contact with others. °· Keep your skin and clothes clean around the abscess. °· Keep all doctor visits as told. °GET HELP RIGHT AWAY IF:  °· You have more pain, puffiness (swelling), or redness in the wound site. °· You have more fluid or blood coming from the wound site. °· You have muscle aches, chills, or you feel sick. °· You have a fever. °MAKE SURE YOU:  °· Understand these instructions. °· Will watch your condition. °· Will get help right away if you are not doing well or get worse. °  °This information is not intended to replace advice given to you by your health care provider. Make sure you discuss any questions you have with your health care provider. °  °Document Released: 06/20/2007 Document Revised: 07/03/2011 Document Reviewed: 03/17/2011 °Elsevier Interactive Patient Education ©2016 [employer] ° °

## 2015-06-14 NOTE — ED Notes (Signed)
Mia SarksFrank Patrick, PA aware of BP and approves DC. PT instructed to follow up with her PCP regarding elevated BP

## 2021-09-11 NOTE — Progress Notes (Deleted)
  Subjective:    NYLIAH NIERENBERG - 41 y.o. female MRN [id number]  Date of birth: 16-Nov-1980  HPI  GIZELLA [place]ILLE is to establish care.   Current issues and/or concerns:  ROS per HPI     Health Maintenance:  Health Maintenance Due  Topic Date Due   HIV Screening  Never done   Hepatitis C Screening  Never done   PAP SMEAR-Modifier  Never done   INFLUENZA VACCINE  08/15/2021     Past Medical History: Patient Active Problem List   Diagnosis Date Noted   Preterm premature rupture of membranes (PPROM) delivered, current hospitalization 01/05/2013      Social History   reports that she has never smoked. She has never used smokeless tobacco. She reports current alcohol use. She reports that she does not use drugs.   Family History  family history includes Alzheimer's disease in her paternal grandmother; Diabetes in her brother and maternal grandmother; Diverticulitis in her maternal grandmother; Diverticulosis in her mother; Gout in her maternal grandfather; Hypertension in her mother.   Medications: reviewed and updated   Objective:   Physical Exam There were no vitals taken for this visit. Physical Exam      Assessment & Plan:         Patient was given clear instructions to go to Emergency Department or return to medical center if symptoms don't improve, worsen, or new problems develop.The patient verbalized understanding.  I discussed the assessment and treatment plan with the patient. The patient was provided an opportunity to ask questions and all were answered. The patient agreed with the plan and demonstrated an understanding of the instructions.   The patient was advised to call back or seek an in-person evaluation if the symptoms worsen or if the condition fails to improve as anticipated.    Ricky Stabs, NP 09/11/2021, 12:30 PM Primary Care at [facility]

## 2021-09-20 ENCOUNTER — Ambulatory Visit: Payer: 59 | Admitting: Family

## 2021-09-20 DIAGNOSIS — Z7689 Persons encountering health services in other specified circumstances: Secondary | ICD-10-CM
